# Patient Record
Sex: Male | Born: 1975 | Hispanic: No | Marital: Single | Smoking: Former smoker
Health system: Southern US, Community
[De-identification: ages and names within clinical notes are randomized; demographics above are authoritative.]

## PROBLEM LIST (undated history)

## (undated) DIAGNOSIS — Q676 Pectus excavatum: Secondary | ICD-10-CM

## (undated) DIAGNOSIS — J189 Pneumonia, unspecified organism: Secondary | ICD-10-CM

## (undated) HISTORY — DX: Pectus excavatum: Q67.6

## (undated) HISTORY — PX: CHEST SURGERY: SHX595

## (undated) HISTORY — DX: Pneumonia, unspecified organism: J18.9

---

## 1998-10-11 ENCOUNTER — Encounter: Admission: RE | Admit: 1998-10-11 | Discharge: 1998-10-29 | Payer: Self-pay | Admitting: *Deleted

## 2004-03-29 ENCOUNTER — Ambulatory Visit (HOSPITAL_COMMUNITY): Admission: RE | Admit: 2004-03-29 | Discharge: 2004-03-29 | Payer: Self-pay

## 2006-04-01 ENCOUNTER — Encounter: Admission: RE | Admit: 2006-04-01 | Discharge: 2006-04-24 | Payer: Self-pay | Admitting: *Deleted

## 2010-02-17 ENCOUNTER — Encounter: Payer: Self-pay | Admitting: Urology

## 2013-05-04 ENCOUNTER — Emergency Department (HOSPITAL_COMMUNITY)
Admission: EM | Admit: 2013-05-04 | Discharge: 2013-05-04 | Disposition: A | Discharge status: Home / Self Care | Payer: 59 | Attending: Emergency Medicine | Admitting: Emergency Medicine

## 2013-05-04 ENCOUNTER — Encounter (HOSPITAL_COMMUNITY): Payer: Self-pay | Admitting: Emergency Medicine

## 2013-05-04 DIAGNOSIS — S139XXA Sprain of joints and ligaments of unspecified parts of neck, initial encounter: Secondary | ICD-10-CM | POA: Insufficient documentation

## 2013-05-04 DIAGNOSIS — Z79899 Other long term (current) drug therapy: Secondary | ICD-10-CM | POA: Insufficient documentation

## 2013-05-04 DIAGNOSIS — S339XXA Sprain of unspecified parts of lumbar spine and pelvis, initial encounter: Secondary | ICD-10-CM | POA: Diagnosis not present

## 2013-05-04 DIAGNOSIS — S39012A Strain of muscle, fascia and tendon of lower back, initial encounter: Secondary | ICD-10-CM

## 2013-05-04 DIAGNOSIS — Y92410 Unspecified street and highway as the place of occurrence of the external cause: Secondary | ICD-10-CM | POA: Insufficient documentation

## 2013-05-04 DIAGNOSIS — S161XXA Strain of muscle, fascia and tendon at neck level, initial encounter: Secondary | ICD-10-CM

## 2013-05-04 DIAGNOSIS — S0993XA Unspecified injury of face, initial encounter: Secondary | ICD-10-CM | POA: Diagnosis present

## 2013-05-04 DIAGNOSIS — Y9389 Activity, other specified: Secondary | ICD-10-CM | POA: Diagnosis not present

## 2013-05-04 DIAGNOSIS — S335XXA Sprain of ligaments of lumbar spine, initial encounter: Secondary | ICD-10-CM

## 2013-05-04 MED ORDER — HYDROCODONE-ACETAMINOPHEN 5-325 MG PO TABS: 1.0000 | ORAL_TABLET | Freq: Four times a day (QID) | ORAL | Status: DC | PRN

## 2013-05-04 MED ORDER — NAPROXEN 500 MG PO TABS: 500.0000 mg | ORAL_TABLET | Freq: Two times a day (BID) | ORAL | Status: DC
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
## 2013-05-13 NOTE — ED Provider Notes (Signed)
Medical screening examination/treatment/procedure(s) were performed by non-physician practitioner and as supervising physician I was immediately available for consultation/collaboration.   EKG Interpretation None       Leota Jacobsen, MD 05/13/13 1820

## 2014-01-21 ENCOUNTER — Emergency Department (HOSPITAL_BASED_OUTPATIENT_CLINIC_OR_DEPARTMENT_OTHER)
Admission: EM | Admit: 2014-01-21 | Discharge: 2014-01-21 | Disposition: A | Discharge status: Home / Self Care | Payer: 59 | Attending: Emergency Medicine | Admitting: Emergency Medicine

## 2014-01-21 ENCOUNTER — Encounter (HOSPITAL_BASED_OUTPATIENT_CLINIC_OR_DEPARTMENT_OTHER): Payer: Self-pay

## 2014-01-21 DIAGNOSIS — M791 Myalgia: Secondary | ICD-10-CM | POA: Diagnosis not present

## 2014-01-21 DIAGNOSIS — J111 Influenza due to unidentified influenza virus with other respiratory manifestations: Secondary | ICD-10-CM | POA: Diagnosis not present

## 2014-01-21 DIAGNOSIS — R69 Illness, unspecified: Secondary | ICD-10-CM

## 2014-01-21 DIAGNOSIS — R509 Fever, unspecified: Secondary | ICD-10-CM | POA: Diagnosis present

## 2014-01-21 MED ORDER — ONDANSETRON 4 MG PO TBDP: 4.0000 mg | ORAL_TABLET | Freq: Three times a day (TID) | ORAL | Status: DC | PRN

## 2014-01-21 MED ORDER — OSELTAMIVIR PHOSPHATE 75 MG PO CAPS: 75.0000 mg | ORAL_CAPSULE | Freq: Two times a day (BID) | ORAL | Status: DC

## 2014-01-21 MED ORDER — HYDROCOD POLST-CHLORPHEN POLST 10-8 MG/5ML PO LQCR: 5.0000 mL | Freq: Two times a day (BID) | ORAL | Status: DC | PRN

## 2014-01-21 MED ORDER — IBUPROFEN 800 MG PO TABS
800.0000 mg | ORAL_TABLET | Freq: Once | ORAL | Status: AC
  Administered 2014-01-21: 800 mg via ORAL
  Filled 2014-01-21: qty 1

## 2014-01-21 NOTE — Discharge Instructions (Signed)
Take the prescribed medication as directed.  Drink plenty of fluids and rest. Follow-up with your primary care physician. Return to the ED for new or worsening symptoms.

## 2014-01-21 NOTE — ED Notes (Signed)
Patient here with fever that started yesterday with aching and congestion. Has been taking tylenol with minimal relief

## 2014-01-21 NOTE — ED Provider Notes (Signed)
CSN: [id number]     Arrival date & time 01/21/14  1827 History   First MD Initiated Contact with Patient 01/21/14 1912     Chief Complaint  Patient presents with  . Fever     (Consider location/radiation/quality/duration/timing/severity/associated sxs/prior Treatment) Patient is a 38 y.o. male presenting with fever. The history is provided by the patient and medical records.  Fever Associated symptoms: congestion, cough and myalgias     This is a 38 year old male with no significant past medical history presenting to the ED for fever, nasal congestion, generalized body aches. Patient states symptoms initially began yesterday, very mild in nature but upon waking this morning were severe. He states his entire body "aches". Fever earlier today was as high as 103.26F. He endorses a dry cough and nausea. No abdominal pain, vomiting, or diarrhea. No chest pain or shortness of breath. Patient is a multiple sick contacts over the past several days. Has taken Tylenol with minimal improvement of symptoms.  No headache, dizziness, weakness, neck pain, tinnitus, or confusion.  History reviewed. No pertinent past medical history. History reviewed. No pertinent past surgical history. No family history on file. History  Substance Use Topics  . Smoking status: Never Smoker   . Smokeless tobacco: Not on file  . Alcohol Use: No    Review of Systems  Constitutional: Positive for fever.  HENT: Positive for congestion.   Respiratory: Positive for cough.   Musculoskeletal: Positive for myalgias.  All other systems reviewed and are negative.     Allergies  Augmentin; Tape; and Tetracyclines & related  Home Medications   Prior to Admission medications   Medication Sig Start Date End Date Taking? Authorizing Provider  acetaminophen (TYLENOL) 500 MG tablet Take 1,000 mg by mouth every 4 (four) hours as needed. Took last dose 5pm   Yes Historical Provider, MD  OVER THE COUNTER MEDICATION Take 1  tablet by mouth daily. Potassium vitamin    Historical Provider, MD   BP 127/92 mmHg  Pulse 108  Temp(Src) 101.2 F (38.4 C) (Oral)  Resp 20  Wt 222 lb (100.699 kg)  SpO2 97%   Physical Exam  Constitutional: He is oriented to person, place, and time. He appears well-developed and well-nourished.  Appears flushed, warm to touch  HENT:  Head: Normocephalic and atraumatic.  Right Ear: Tympanic membrane and ear canal normal.  Left Ear: Tympanic membrane and ear canal normal.  Nose: Nose normal.  Mouth/Throat: Uvula is midline, oropharynx is clear and moist and mucous membranes are normal. No oropharyngeal exudate, posterior oropharyngeal edema, posterior oropharyngeal erythema or tonsillar abscesses.  Tonsils normal in appearance bilaterally without exudate; uvula midline without peritonsillar abscess; handling secretions appropriately; no difficulty swallowing or speaking  Eyes: Conjunctivae and EOM are normal. Pupils are equal, round, and reactive to light.  Neck: Normal range of motion and full passive range of motion without pain. Neck supple. No spinous process tenderness and no muscular tenderness present. No rigidity. No edema present.  No meningismus  Cardiovascular: Normal rate, regular rhythm and normal heart sounds.   Pulmonary/Chest: Effort normal and breath sounds normal. No accessory muscle usage. No respiratory distress. He has no wheezes. He has no rhonchi. He has no rales.  Dry cough noted, lungs clear bilaterally  Abdominal: Soft. Bowel sounds are normal. There is no tenderness. There is no guarding.  Musculoskeletal: Normal range of motion. He exhibits no edema.  Neurological: He is alert and oriented to person, place, and time.  Skin: Skin is  warm and dry.  Psychiatric: He has a normal mood and affect.  Nursing note and vitals reviewed.   ED Course  Procedures (including critical care time) Labs Review Labs Reviewed - No data to display  Imaging Review No  results found.   EKG Interpretation None      MDM   Final diagnoses:  Influenza-like illness   38 year old male with fever, cough, nasal congestion and generalized body aches. On exam, patient febrile but nontoxic in appearance. Lungs clear bilaterally without wheezes or rhonchi to suggest pneumonia.  No nuchal rigidity to suggest meningitis.  Patient with likely influenza or other viral illness. He will be started on Tamiflu, cough medication, and Zofran. He was encouraged to continue Tylenol or Motrin at home as needed for fever. Will follow-up with primary care physician.  Discussed plan with patient, he/she acknowledged understanding and agreed with plan of care.  Return precautions given for new or worsening symptoms.  Larene Pickett, PA-C 01/21/14 2128  Larene Pickett, PA-C 01/21/14 2128  Larene Pickett, PA-C 01/21/14 2129  Veryl Speak, MD 01/22/14 [phone]

## 2014-01-27 NOTE — ED Notes (Signed)
Spouse of patient called to state that the patient continues to have fevers of 102.  Was seen and rechecked by PCP.  States patient is able to drink enough fluids. Encouraged to continue to treat fevers with alternating of tylenol and ibuprofen, to use cough with prescribed meds, and if concerned or worsening conditions, to come back in for further testing.

## 2014-01-28 ENCOUNTER — Emergency Department (HOSPITAL_COMMUNITY): Payer: 59

## 2014-01-28 ENCOUNTER — Emergency Department (HOSPITAL_COMMUNITY)
Admission: EM | Admit: 2014-01-28 | Discharge: 2014-01-28 | Disposition: A | Discharge status: Home / Self Care | Payer: 59 | Attending: Emergency Medicine | Admitting: Emergency Medicine

## 2014-01-28 ENCOUNTER — Encounter (HOSPITAL_COMMUNITY): Payer: Self-pay | Admitting: Emergency Medicine

## 2014-01-28 DIAGNOSIS — J159 Unspecified bacterial pneumonia: Secondary | ICD-10-CM | POA: Insufficient documentation

## 2014-01-28 DIAGNOSIS — J189 Pneumonia, unspecified organism: Secondary | ICD-10-CM

## 2014-01-28 DIAGNOSIS — Z79899 Other long term (current) drug therapy: Secondary | ICD-10-CM | POA: Diagnosis not present

## 2014-01-28 DIAGNOSIS — R05 Cough: Secondary | ICD-10-CM | POA: Diagnosis present

## 2014-01-28 LAB — CBC WITH DIFFERENTIAL/PLATELET
BASOS ABS: 0 10*3/uL (ref 0.0–0.1)
BASOS PCT: 0 % (ref 0–1)
EOS PCT: 0 % (ref 0–5)
Eosinophils Absolute: 0 10*3/uL (ref 0.0–0.7)
HEMATOCRIT: 41.4 % (ref 39.0–52.0)
Hemoglobin: 13.5 g/dL (ref 13.0–17.0)
Lymphocytes Relative: 12 % (ref 12–46)
Lymphs Abs: 0.9 10*3/uL (ref 0.7–4.0)
MCH: 26.6 pg (ref 26.0–34.0)
MCHC: 32.6 g/dL (ref 30.0–36.0)
MCV: 81.7 fL (ref 78.0–100.0)
MONO ABS: 1 10*3/uL (ref 0.1–1.0)
Monocytes Relative: 13 % — ABNORMAL HIGH (ref 3–12)
NEUTROS ABS: 5.6 10*3/uL (ref 1.7–7.7)
NEUTROS PCT: 75 % (ref 43–77)
PLATELETS: 215 10*3/uL (ref 150–400)
RBC: 5.07 MIL/uL (ref 4.22–5.81)
RDW: 13.3 % (ref 11.5–15.5)
WBC: 7.6 10*3/uL (ref 4.0–10.5)

## 2014-01-28 LAB — COMPREHENSIVE METABOLIC PANEL
ALBUMIN: 3.1 g/dL — AB (ref 3.5–5.2)
ALK PHOS: 172 U/L — AB (ref 39–117)
ALT: 70 U/L — ABNORMAL HIGH (ref 0–53)
ANION GAP: 10 (ref 5–15)
AST: 38 U/L — ABNORMAL HIGH (ref 0–37)
BILIRUBIN TOTAL: 0.8 mg/dL (ref 0.3–1.2)
BUN: 6 mg/dL (ref 6–23)
CO2: 26 mmol/L (ref 19–32)
CREATININE: 0.83 mg/dL (ref 0.50–1.35)
Calcium: 8.5 mg/dL (ref 8.4–10.5)
Chloride: 98 mEq/L (ref 96–112)
GFR calc Af Amer: >90 mL/min (ref >90)
GFR calc non Af Amer: >90 mL/min (ref >90)
GLUCOSE: 128 mg/dL — AB (ref 70–99)
POTASSIUM: 4 mmol/L (ref 3.5–5.1)
Sodium: 134 mmol/L — ABNORMAL LOW (ref 135–145)
TOTAL PROTEIN: 6.5 g/dL (ref 6.0–8.3)

## 2014-01-28 MED ORDER — AZITHROMYCIN 250 MG PO TABS: ORAL_TABLET | ORAL | Status: DC

## 2014-01-28 MED ORDER — DEXTROSE 5 % IV SOLN
1.0000 g | Freq: Once | INTRAVENOUS | Status: AC
  Administered 2014-01-28: 1 g via INTRAVENOUS
  Filled 2014-01-28: qty 10

## 2014-01-28 MED ORDER — SODIUM CHLORIDE 0.9 % IV BOLUS (SEPSIS)
1000.0000 mL | Freq: Once | INTRAVENOUS | Status: AC
  Administered 2014-01-28: 1000 mL via INTRAVENOUS

## 2014-01-28 MED ORDER — AEROCHAMBER PLUS FLO-VU MEDIUM MISC
1.0000 | Freq: Once | Status: AC
Start: 2014-01-28 — End: 2014-01-28
  Administered 2014-01-28: 1
  Filled 2014-01-28: qty 1

## 2014-01-28 MED ORDER — IPRATROPIUM-ALBUTEROL 0.5-2.5 (3) MG/3ML IN SOLN
3.0000 mL | Freq: Once | RESPIRATORY_TRACT | Status: AC
  Administered 2014-01-28: 3 mL via RESPIRATORY_TRACT
  Filled 2014-01-28: qty 3

## 2014-01-28 MED ORDER — IPRATROPIUM BROMIDE 0.02 % IN SOLN
0.5000 mg | Freq: Once | RESPIRATORY_TRACT | Status: AC
  Administered 2014-01-28: 0.5 mg via RESPIRATORY_TRACT
  Filled 2014-01-28: qty 2.5

## 2014-01-28 MED ORDER — GUAIFENESIN ER 1200 MG PO TB12: 1.0000 | ORAL_TABLET | Freq: Two times a day (BID) | ORAL | Status: DC

## 2014-01-28 MED ORDER — ALBUTEROL SULFATE (2.5 MG/3ML) 0.083% IN NEBU
5.0000 mg | INHALATION_SOLUTION | Freq: Once | RESPIRATORY_TRACT | Status: AC
  Administered 2014-01-28: 5 mg via RESPIRATORY_TRACT
  Filled 2014-01-28: qty 6

## 2014-01-28 MED ORDER — ACETAMINOPHEN-CODEINE 120-12 MG/5ML PO SOLN: 10.0000 mL | ORAL | Status: DC | PRN

## 2014-01-28 MED ORDER — ALBUTEROL SULFATE HFA 108 (90 BASE) MCG/ACT IN AERS
2.0000 | INHALATION_SPRAY | Freq: Once | RESPIRATORY_TRACT | Status: AC
  Administered 2014-01-28: 2 via RESPIRATORY_TRACT
  Filled 2014-01-28: qty 6.7

## 2014-01-28 NOTE — ED Provider Notes (Signed)
CSN: [id number]     Arrival date & time 01/28/14  0407 History   First MD Initiated Contact with Patient 01/28/14 [phone]     Chief Complaint  Patient presents with  . Cough     (Consider location/radiation/quality/duration/timing/severity/associated sxs/prior Treatment) Patient is a 39 y.o. male presenting with cough. The history is provided by the patient. No language interpreter was used.  Cough Cough characteristics:  Productive, hacking and paroxysmal Severity:  Severe Duration:  7 days Timing:  Constant Progression:  Worsening Smoker: no   Associated symptoms: sore throat   Associated symptoms: no chills, no fever, no myalgias and no rash   Associated symptoms comment:  Diagnosed with flu on 12/25 with symptoms of cough, body aches, fever. Symptoms persist now and are worse prompting return visit to the ED. No vomiting. Minimal nasal/sinus congestion.    History reviewed. No pertinent past medical history. History reviewed. No pertinent past surgical history. History reviewed. No pertinent family history. History  Substance Use Topics  . Smoking status: Never Smoker   . Smokeless tobacco: Not on file  . Alcohol Use: No    Review of Systems  Constitutional: Negative for fever and chills.  HENT: Positive for sore throat.   Respiratory: Positive for cough and chest tightness.   Cardiovascular: Negative.   Gastrointestinal: Negative.  Negative for vomiting.  Musculoskeletal: Negative.  Negative for myalgias.  Skin: Negative.  Negative for rash.  Neurological: Negative.       Allergies  Augmentin; Tape; and Tetracyclines & related  Home Medications   Prior to Admission medications   Medication Sig Start Date End Date Taking? Authorizing Provider  acetaminophen (TYLENOL) 500 MG tablet Take 1,000 mg by mouth every 4 (four) hours as needed. Took last dose 5pm   Yes Historical Provider, MD  chlorpheniramine-HYDROcodone (TUSSIONEX PENNKINETIC ER) 10-8 MG/5ML LQCR Take 5  mLs by mouth every 12 (twelve) hours as needed for cough (Cough). 01/21/14  Yes Larene Pickett, PA-C  guaiFENesin (MUCINEX) 600 MG 12 hr tablet Take by mouth 2 (two) times daily as needed for cough.   Yes Historical Provider, MD  ibuprofen (ADVIL,MOTRIN) 200 MG tablet Take 800 mg by mouth every 6 (six) hours as needed for moderate pain.   Yes Historical Provider, MD  ondansetron (ZOFRAN ODT) 4 MG disintegrating tablet Take 1 tablet (4 mg total) by mouth every 8 (eight) hours as needed for nausea. 01/21/14  Yes Larene Pickett, PA-C  OVER THE COUNTER MEDICATION Take 1 tablet by mouth daily. Potassium vitamin   Yes Historical Provider, MD  oseltamivir (TAMIFLU) 75 MG capsule Take 1 capsule (75 mg total) by mouth every 12 (twelve) hours. Patient not taking: Reported on 01/28/2014 01/21/14   Larene Pickett, PA-C   BP 129/71 mmHg  Pulse 116  Temp(Src) 100.1 F (37.8 C) (Oral)  Resp 16  SpO2 90% Physical Exam  Constitutional: He is oriented to person, place, and time. He appears well-developed and well-nourished. No distress.  HENT:  Head: Normocephalic.  Neck: Normal range of motion.  Pulmonary/Chest: Effort normal. Tachypnea noted. He has no wheezes. He has rales.  Abdominal: Soft. There is no tenderness.  Musculoskeletal: He exhibits no edema.  Neurological: He is alert and oriented to person, place, and time.  Skin: Skin is warm and dry.  Psychiatric: He has a normal mood and affect.    ED Course  Procedures (including critical care time) Labs Review Labs Reviewed  COMPREHENSIVE METABOLIC PANEL  CBC WITH DIFFERENTIAL  Imaging Review No results found.   EKG Interpretation None      MDM   Final diagnoses:  None    Cough  Has been treated for viral process x 7-8 days now with worsening symptoms of fever, paroxsymal cough, SOB, malaise and myalgias. His O2 sat is low at 90%, he is tachycardic and appears ill. Suspect pneumonia and potential admission. Patient care  transferred to [facility], PA-C, pending lab and x-ray studies.    Dewaine Oats, PA-C 01/28/14 [place], MD 01/28/14 [phone]

## 2014-01-28 NOTE — ED Notes (Addendum)
Pt reports that he was dx with influenza on 12/25, placed on Tamiflu for 5 days. Seen by PCP on 12/30 and tested negative for strep or influenza panel, but still c/o body aches, fever and cough. Pt states that he continues to have this even after taking OTC Mucinex that the PCP told him to get. Pt states his main concern is his continued coughing has caused bright red streaks of blood in his sputum. Pt reports last taking x3 extra strength tablets of Acetaminophen at 0330.

## 2014-01-28 NOTE — ED Notes (Signed)
With movement/ambulation pt cough agitated. Ascultation over lung sounds post ambulation slight expiratory wheeze heard over lung fields but unlabored in nature.

## 2014-01-28 NOTE — Discharge Instructions (Signed)
Return here as needed.  Your x-ray showed pneumonia.  Follow-up with the clinic provided.  Increase your fluid intake, rest as much as possible

## 2014-01-28 NOTE — ED Provider Notes (Signed)
Patient is ambulated to the bathroom and maintained a pulse oximetry of 94%.  Patient is advised to return here as needed.  He is given a breathing treatment here as well.  The Rocephin.  He will be sent home with Zithromax.  Told to return here as needed  Brent General, PA-C 01/28/14 3893  Evelina Bucy, MD 01/29/14 [phone]

## 2014-02-06 ENCOUNTER — Ambulatory Visit (HOSPITAL_COMMUNITY)
Admission: RE | Admit: 2014-02-06 | Discharge: 2014-02-06 | Disposition: A | Discharge status: Home / Self Care | Payer: 59 | Source: Ambulatory Visit | Attending: *Deleted | Admitting: *Deleted

## 2014-02-06 ENCOUNTER — Other Ambulatory Visit (HOSPITAL_COMMUNITY): Payer: Self-pay | Admitting: *Deleted

## 2014-02-06 DIAGNOSIS — J189 Pneumonia, unspecified organism: Secondary | ICD-10-CM | POA: Diagnosis not present

## 2014-02-13 ENCOUNTER — Ambulatory Visit (HOSPITAL_COMMUNITY)
Admission: RE | Admit: 2014-02-13 | Discharge: 2014-02-13 | Disposition: A | Discharge status: Home / Self Care | Payer: 59 | Source: Ambulatory Visit | Attending: *Deleted | Admitting: *Deleted

## 2014-02-13 ENCOUNTER — Other Ambulatory Visit (HOSPITAL_COMMUNITY): Payer: Self-pay | Admitting: *Deleted

## 2014-02-13 DIAGNOSIS — R0781 Pleurodynia: Secondary | ICD-10-CM | POA: Insufficient documentation

## 2014-02-13 DIAGNOSIS — J189 Pneumonia, unspecified organism: Secondary | ICD-10-CM

## 2014-02-13 DIAGNOSIS — R918 Other nonspecific abnormal finding of lung field: Secondary | ICD-10-CM | POA: Diagnosis not present

## 2014-02-20 ENCOUNTER — Encounter (INDEPENDENT_AMBULATORY_CARE_PROVIDER_SITE_OTHER): Payer: Self-pay

## 2014-02-20 ENCOUNTER — Ambulatory Visit (INDEPENDENT_AMBULATORY_CARE_PROVIDER_SITE_OTHER): Payer: 59 | Admitting: Pulmonary Disease

## 2014-02-20 ENCOUNTER — Encounter: Payer: Self-pay | Admitting: Pulmonary Disease

## 2014-02-20 VITALS — BP 146/82 | HR 104 | Temp 97.3°F | Ht 74.0 in | Wt 215.4 lb

## 2014-02-20 DIAGNOSIS — J189 Pneumonia, unspecified organism: Secondary | ICD-10-CM

## 2014-02-20 NOTE — Assessment & Plan Note (Signed)
The patient has had a severe community-acquired pneumonia at that was bilateral in nature with extensive infiltrates, but has made significant improvement since being on antibiotics. His serial chest x-rays have shown improvement on each successive film, and all of his symptoms are improved as well. I have explained to the patient that it often takes 6-8 weeks for a chest x-ray to completely clear even after optimal treatment for pneumonia. He can continue on Mucinex for mucociliary clearance, but I think it is okay for him to stop the Advair and his second course of Levaquin. He can also use Advil for his musculoskeletal discomfort. I would like to recheck an x-ray in 4 weeks to make sure that everything is clearing, and he is to call me if he feels that his symptoms are worsening.

## 2014-02-20 NOTE — Patient Instructions (Signed)
Stop levaquin and advair Can take albuterol if needed. Stay on your mucinex if it is helping with cough and mucus clearance. Would like to check a followup chest xray in 4 weeks.  Will call you with results. Please call us if you feel you are taking step backwards.

## 2014-02-20 NOTE — Progress Notes (Signed)
   Subjective:    Patient ID: Alan Shepard, male    DOB: 1975-04-23, 39 y.o.   MRN: [id number]  HPI The patient is a 39 year old male who I've been asked to see for community-acquired pneumonia. He started having symptoms on Christmas Day with high fever, dry cough, and malaise. He was felt to possibly have the flu, and was treated with a course of Tamiflu. He did not feel that he got any better, and return to the emergency room on January 2 where he had started producing purulent mucus streaks of blood. A chest x-ray at that time showed significant bilateral lower lobe infiltrates, and he was started on a Z-Pak for community-acquired pneumonia. He was also given IV antibiotics in the emergency room. The patient did have improvement in his symptoms, but was started on a ten-day course of Levaquin by his primary care physician. He has subsequently finished that and started on another 10 days about 2 days ago. Serial chest x-rays have shown definite improvement in his lower lobe process, and he feels that his breathing is much improved. He will still get some shortness of breath and cough with prolonged conversation, but his quantity of mucus is significantly decreased. His mucus also has very little color at this point in time.   Review of Systems  Constitutional: Positive for fever. Negative for unexpected weight change.  HENT: Positive for postnasal drip. Negative for congestion, dental problem, ear pain, nosebleeds, rhinorrhea, sinus pressure, sneezing, sore throat and trouble swallowing.   Eyes: Negative for redness and itching.  Respiratory: Positive for cough, chest tightness, shortness of breath and wheezing.   Cardiovascular: Negative for palpitations and leg swelling.  Gastrointestinal: Negative for nausea and vomiting.  Genitourinary: Negative for dysuria.  Musculoskeletal: Negative for joint swelling.  Skin: Negative for rash.  Neurological: Negative for headaches.  Hematological: Does  not bruise/bleed easily.  Psychiatric/Behavioral: Negative for dysphoric mood. The patient is not nervous/anxious.        Objective:   Physical Exam Constitutional:  Well developed, no acute distress  HENT:  Nares patent without discharge  Oropharynx without exudate, palate and uvula are normal  Eyes:  Perrla, eomi, no scleral icterus  Neck:  No JVD, no TMG  Cardiovascular:  Normal rate, regular rhythm, no rubs or gallops.  No murmurs        Intact distal pulses  Pulmonary :  Normal breath sounds, no stridor or respiratory distress   No rales, rhonchi, or wheezing  Abdominal:  Soft, nondistended, bowel sounds present.  No tenderness noted.   Musculoskeletal:  No lower extremity edema noted.  Lymph Nodes:  No cervical lymphadenopathy noted  Skin:  No cyanosis noted  Neurologic:  Alert, appropriate, moves all 4 extremities without obvious deficit.         Assessment & Plan:

## 2014-02-23 ENCOUNTER — Telehealth (HOSPITAL_COMMUNITY): Payer: Self-pay

## 2014-02-23 NOTE — Telephone Encounter (Signed)
I have called and left a message with Penny to inquire about participation in Pulmonary Rehab. Will send letter in mail and follow up.

## 2014-02-27 ENCOUNTER — Telehealth (HOSPITAL_COMMUNITY): Payer: Self-pay

## 2014-02-27 ENCOUNTER — Institutional Professional Consult (permissible substitution): Payer: 59 | Admitting: Internal Medicine

## 2014-02-27 NOTE — Telephone Encounter (Signed)
I have called and left a second message with Alexiz to inquire about participation in Pulmonary Rehab.

## 2014-03-16 ENCOUNTER — Telehealth (HOSPITAL_COMMUNITY): Payer: Self-pay

## 2014-03-16 NOTE — Telephone Encounter (Signed)
I have called and left a message with Wayde to inquire about participation in Pulmonary Rehab. Will send letter in mail and follow up.

## 2014-03-21 ENCOUNTER — Ambulatory Visit (INDEPENDENT_AMBULATORY_CARE_PROVIDER_SITE_OTHER)
Admission: RE | Admit: 2014-03-21 | Discharge: 2014-03-21 | Disposition: A | Discharge status: Home / Self Care | Payer: 59 | Source: Ambulatory Visit | Attending: Pulmonary Disease | Admitting: Pulmonary Disease

## 2014-03-21 ENCOUNTER — Telehealth: Payer: Self-pay | Admitting: Pulmonary Disease

## 2014-03-21 DIAGNOSIS — J189 Pneumonia, unspecified organism: Secondary | ICD-10-CM

## 2014-03-21 NOTE — Progress Notes (Signed)
Quick Note:  Spoke with pt and notified of results per Dr. Gwenette Greet. Pt verbalized understanding and denied any questions.  ______

## 2014-03-21 NOTE — Telephone Encounter (Signed)
Let the pt know that his pna has completely resolved. The radiologist mentions something on the right that he thinks is related to his sternum where it curves inward. As long as he feels he is back to normal and doing well, no further followup is needed.  Spoke with the pt and notified of results and he verbalized understanding  He states that he is still coughing some and having some chest discomfort, only when he coughs  OV with TP for 03/27/14 at 2 pm

## 2014-03-27 ENCOUNTER — Encounter: Payer: Self-pay | Admitting: Adult Health

## 2014-03-27 ENCOUNTER — Ambulatory Visit (INDEPENDENT_AMBULATORY_CARE_PROVIDER_SITE_OTHER): Payer: 59 | Admitting: Adult Health

## 2014-03-27 VITALS — BP 106/74 | HR 93 | Temp 98.2°F | Ht 74.0 in | Wt 213.6 lb

## 2014-03-27 DIAGNOSIS — J189 Pneumonia, unspecified organism: Secondary | ICD-10-CM

## 2014-03-27 MED ORDER — PREDNISONE 10 MG PO TABS: ORAL_TABLET | ORAL | Status: DC

## 2014-03-27 MED ORDER — LEVALBUTEROL HCL 0.63 MG/3ML IN NEBU
0.6300 mg | INHALATION_SOLUTION | Freq: Once | RESPIRATORY_TRACT | Status: AC
  Administered 2014-03-27: 0.63 mg via RESPIRATORY_TRACT

## 2014-03-27 NOTE — Progress Notes (Signed)
   Subjective:    Patient ID: Alan Shepard, male    DOB: 06-May-1975, 39 y.o.   MRN: [id number]  HPI 39 year old male seen for pulmonary consult for community-acquired pneumonia 02/20/14    03/27/2014 Acute OV  Patient returns for persistent cough. Has a lingering dry cough . Has some congestion on/off.  Dx with CAP 12/2013 . tx w/ abx . Feeling better but cough never resolved.  CXR has been improving. Last cxr 2/23 with resolution of LLL opacity . Decreased RML opacity vs pectus deformity.  No fever, discolored mucus, chest pain, orthopnea or edema.  Not using any cough meds.  Does have some post nasal drainage .      Review of Systems  Constitutional:   No  weight loss, night sweats,  Fevers, chills, fatigue, or  lassitude.  HEENT:   No headaches,  Difficulty swallowing,  Tooth/dental problems, or  Sore throat,                No sneezing, itching, ear ache, nasal congestion, post nasal drip,   CV:  No chest pain,  Orthopnea, PND, swelling in lower extremities, anasarca, dizziness, palpitations, syncope.   GI  No heartburn, indigestion, abdominal pain, nausea, vomiting, diarrhea, change in bowel habits, loss of appetite, bloody stools.   Resp:  No chest wall deformity  Skin: no rash or lesions.  GU: no dysuria, change in color of urine, no urgency or frequency.  No flank pain, no hematuria   MS:  No joint pain or swelling.  No decreased range of motion.  No back pain.  Psych:  No change in mood or affect. No depression or anxiety.  No memory loss.          Objective:   Physical Exam Constitutional:  Well developed, no acute distress  HENT:  Nares patent without discharge  Oropharynx without exudate, palate and uvula are normal  Eyes:  Perrla, eomi, no scleral icterus  Neck:  No JVD, no TMG  Cardiovascular:  Normal rate, regular rhythm, no rubs or gallops.  No murmurs        Intact distal pulses  Pulmonary :  Normal breath sounds, no stridor or respiratory  distress   No rales, rhonchi, or wheezing  Abdominal:  Soft, nondistended, bowel sounds present.  No tenderness noted.   Musculoskeletal:  No lower extremity edema noted.  Lymph Nodes:  No cervical lymphadenopathy noted  Skin:  No cyanosis noted  Neurologic:  Alert, appropriate, moves all 4 extremities without obvious deficit.         Assessment & Plan:

## 2014-03-27 NOTE — Assessment & Plan Note (Signed)
Clinically improving with lingering post pneumonic cough  CXR has shown improvement in pneumonia clearance    Plan  Prednisone taper over the next week. Mucinex as needed for cough and congestion Delsym 2 teaspoons twice daily as needed for cough Allegra 180 mg once daily as needed for drainage Follow-up with Dr. Gwenette Greet in 4-6 weeks with chest x-ray Please contact office for sooner follow up if symptoms do not improve or worsen or seek emergency care

## 2014-03-27 NOTE — Progress Notes (Signed)
Ov reviewed, and agree with plan as outlined.

## 2014-03-27 NOTE — Patient Instructions (Signed)
Prednisone taper over the next week. Mucinex as needed for cough and congestion Delsym 2 teaspoons twice daily as needed for cough Allegra 180 mg once daily as needed for drainage Follow-up with Dr. Gwenette Greet in 4-6 weeks with chest x-ray Please contact office for sooner follow up if symptoms do not improve or worsen or seek emergency care

## 2014-03-29 ENCOUNTER — Telehealth: Payer: Self-pay | Admitting: Pulmonary Disease

## 2014-03-29 MED ORDER — PREDNISONE 10 MG PO TABS: ORAL_TABLET | ORAL | Status: DC

## 2014-03-29 NOTE — Telephone Encounter (Signed)
It was sent electroniclly she wanted to kow if it could be called or faxed please advise.Hillery Hunter

## 2014-03-29 NOTE — Telephone Encounter (Signed)
Rx has been resent. Pt's wife is aware.

## 2014-03-30 ENCOUNTER — Telehealth: Payer: Self-pay | Admitting: Pulmonary Disease

## 2014-03-30 NOTE — Telephone Encounter (Signed)
Spoke with Truddie Crumble at [facility]  She states that pt was originally referred by Octavio Graves Then, when they called the pt to get him set up, he said he had upcoming appt with MR and wanted to wait until then to decide to attend rehab or not  The appt with MR was for a consult, but this was cancelled b/c the pt was seen by [facility] sooner  She states that she spoke with MR about this and he actually signed the order after reviewing the pt's records  She states that since [facility] is really his pulmonologist, he can now decide on whether the pt is appropriate or not  Please advise, thanks

## 2014-04-04 NOTE — Telephone Encounter (Signed)
Spoke with Portia at [facility] aware of below per [facility] - Nothing further needed.

## 2014-04-04 NOTE — Telephone Encounter (Signed)
This is being made very complicated.   He is NOT a candidate for pulmonary rehab.

## 2014-05-05 ENCOUNTER — Ambulatory Visit (INDEPENDENT_AMBULATORY_CARE_PROVIDER_SITE_OTHER)
Admission: RE | Admit: 2014-05-05 | Discharge: 2014-05-05 | Disposition: A | Discharge status: Home / Self Care | Payer: 59 | Source: Ambulatory Visit | Attending: Pulmonary Disease | Admitting: Pulmonary Disease

## 2014-05-05 ENCOUNTER — Encounter: Payer: Self-pay | Admitting: Pulmonary Disease

## 2014-05-05 ENCOUNTER — Ambulatory Visit (INDEPENDENT_AMBULATORY_CARE_PROVIDER_SITE_OTHER): Payer: 59 | Admitting: Pulmonary Disease

## 2014-05-05 VITALS — BP 130/76 | HR 85 | Temp 97.5°F | Ht 74.0 in | Wt 210.8 lb

## 2014-05-05 DIAGNOSIS — R053 Chronic cough: Secondary | ICD-10-CM | POA: Insufficient documentation

## 2014-05-05 DIAGNOSIS — J189 Pneumonia, unspecified organism: Secondary | ICD-10-CM

## 2014-05-05 DIAGNOSIS — R05 Cough: Secondary | ICD-10-CM

## 2014-05-05 NOTE — Assessment & Plan Note (Signed)
The patient has no further chest congestion or purulence, and his chest x-ray shows no acute infiltrate but does show his continued right middle lobe process that is most likely secondary to his pectus excavatum.

## 2014-05-05 NOTE — Progress Notes (Signed)
   Subjective:    Patient ID: Alan Shepard, male    DOB: [birth year], 39 y.o.   MRN: [id number]  HPI Patient comes in today for follow-up of his chronic cough. He was diagnosed earlier in the year with community-acquired pneumonia, but responded well to antibiotics. He had a chest x-ray today that shows no acute infiltrate, but shows his persistent right middle lobe process that is most certainly related to pectus excavatum. The patient has no significant chest congestion, and he is not producing mucus. He feels a significant tickle in the back of his throat and upper chest which then leads to coughing. He is continuously clearing his throat during our visit today. He tells me that his cough is worsened with prolonged conversation, inhaling dust particles, and getting around strong odors.   Review of Systems  Constitutional: Negative for fever and unexpected weight change.  HENT: Positive for congestion and postnasal drip. Negative for dental problem, ear pain, nosebleeds, rhinorrhea, sinus pressure, sneezing, sore throat and trouble swallowing.   Eyes: Negative for redness and itching.  Respiratory: Positive for cough, chest tightness and shortness of breath. Negative for wheezing.   Cardiovascular: Negative for palpitations and leg swelling.  Gastrointestinal: Negative for nausea and vomiting.  Genitourinary: Negative for dysuria.  Musculoskeletal: Negative for joint swelling.  Skin: Negative for rash.  Neurological: Negative for headaches.  Hematological: Does not bruise/bleed easily.  Psychiatric/Behavioral: Negative for dysphoric mood. The patient is not nervous/anxious.        Objective:   Physical Exam Well-developed male in no acute distress Nose without purulence or discharge noted Neck without lymphadenopathy or thyromegaly Chest totally clear to auscultation, no wheezing Cardiac exam with regular rate and rhythm Lower extremities without edema, no cyanosis Alert and  oriented, moves all 4 extremities.       Assessment & Plan:

## 2014-05-05 NOTE — Assessment & Plan Note (Signed)
The patient has a chronic cough that is emanating from a tickle in his throat. It is worsened by prolonged conversation, inhalation of dust, strong odors, and ongoing throat clearing. I think this is clearly upper airway in origin, and would like to work on the various treatments for the upper airway cough syndrome. If he sticks to our recommendations closely, and continues to have the issue, we may need to consider a methacholine challenge test to put the issue of asthma to rest 100%.

## 2014-05-05 NOTE — Patient Instructions (Addendum)
Your breathing tests show no obstruction/asthma today, but does not 100% exclude asthma. Will try you on nasonex 2 sprays each nostril every morning until sample runs out.  Let us know if this helps the cough, and we can send in prescription. Try changing allergra to zyrtec for postnasal drip Avoid throat clearing, use hard candy during the day to help prevent the tickle If your cough continues, can consider a methacholine challenge test to put the issue of asthma to rest.

## 2014-06-16 ENCOUNTER — Telehealth: Payer: Self-pay | Admitting: Pulmonary Disease

## 2014-06-16 MED ORDER — MOMETASONE FUROATE 50 MCG/ACT NA SUSP: 2.0000 | Freq: Every day | NASAL | Status: DC

## 2014-06-16 NOTE — Telephone Encounter (Signed)
Spoke with pt, states the samples of Nasonex have improved his symptoms greatly and is requesting a rx to be sent to [facility].  Per last ov note an rx is to be sent if this works well for pt.  This is sent.    Pt would also like to extend his gratitude to [facility] for the exceptional care he's received.  States he understands [facility] is busy and isn't expecting a response but would just like to thank [facility] for his care.  Forwarding to [facility] to make him aware.

## 2014-06-16 NOTE — Telephone Encounter (Signed)
Ok to send in script with 11 fills.

## 2014-06-19 ENCOUNTER — Telehealth: Payer: Self-pay | Admitting: Pulmonary Disease

## 2014-06-19 MED ORDER — MOMETASONE FUROATE 50 MCG/ACT NA SUSP: 2.0000 | Freq: Every day | NASAL | Status: AC

## 2014-06-19 NOTE — Telephone Encounter (Signed)
Rx has been resent. Pt's wife is aware. Nothing further was needed.

## 2015-12-09 IMAGING — CR DG CHEST 2V
2 series · 2 of 2 positions shown · non-contrast
Comparison: None.

CLINICAL DATA: Pt reports that he was dx with influenza on [DATE],
placed on Tamiflu for 5 days. Seen by PCP on [DATE] and tested
negative for strep or influenza panel, but still c/o body aches,
fever and cough. Pt states that he continues to have this even after
taking OTC Mucinex that the PCP told him to get. Pt states his main
concern is his continued coughing has caused bright red streaks of
blood in his sputum. Pt reports last taking x3 extra strength
tablets of Acetaminophen at 5225. symptoms x 9 days, pt states prior
chest surgery at 11 years of age

EXAM:
CHEST  2 VIEW

[w chest pa]
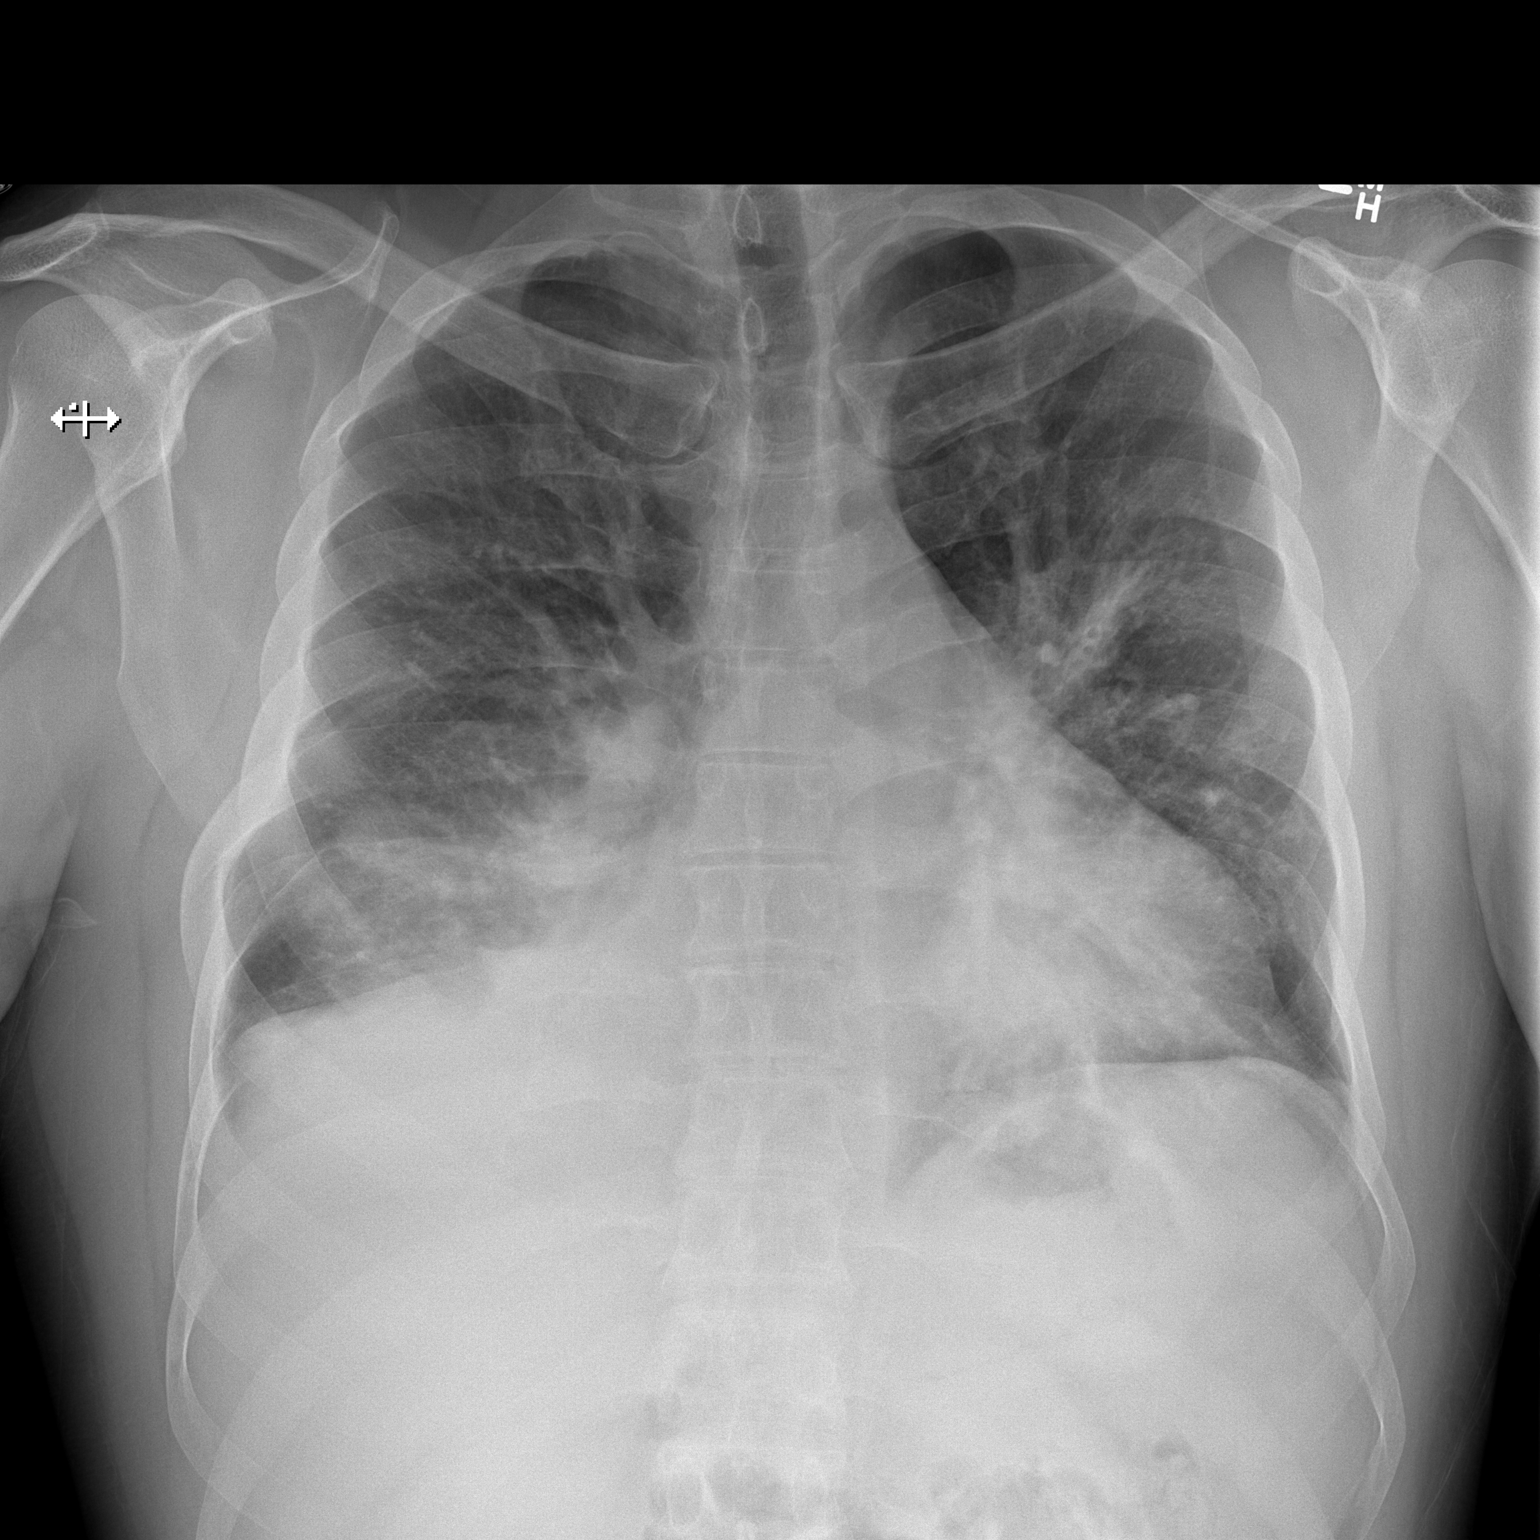

[w chest lat]
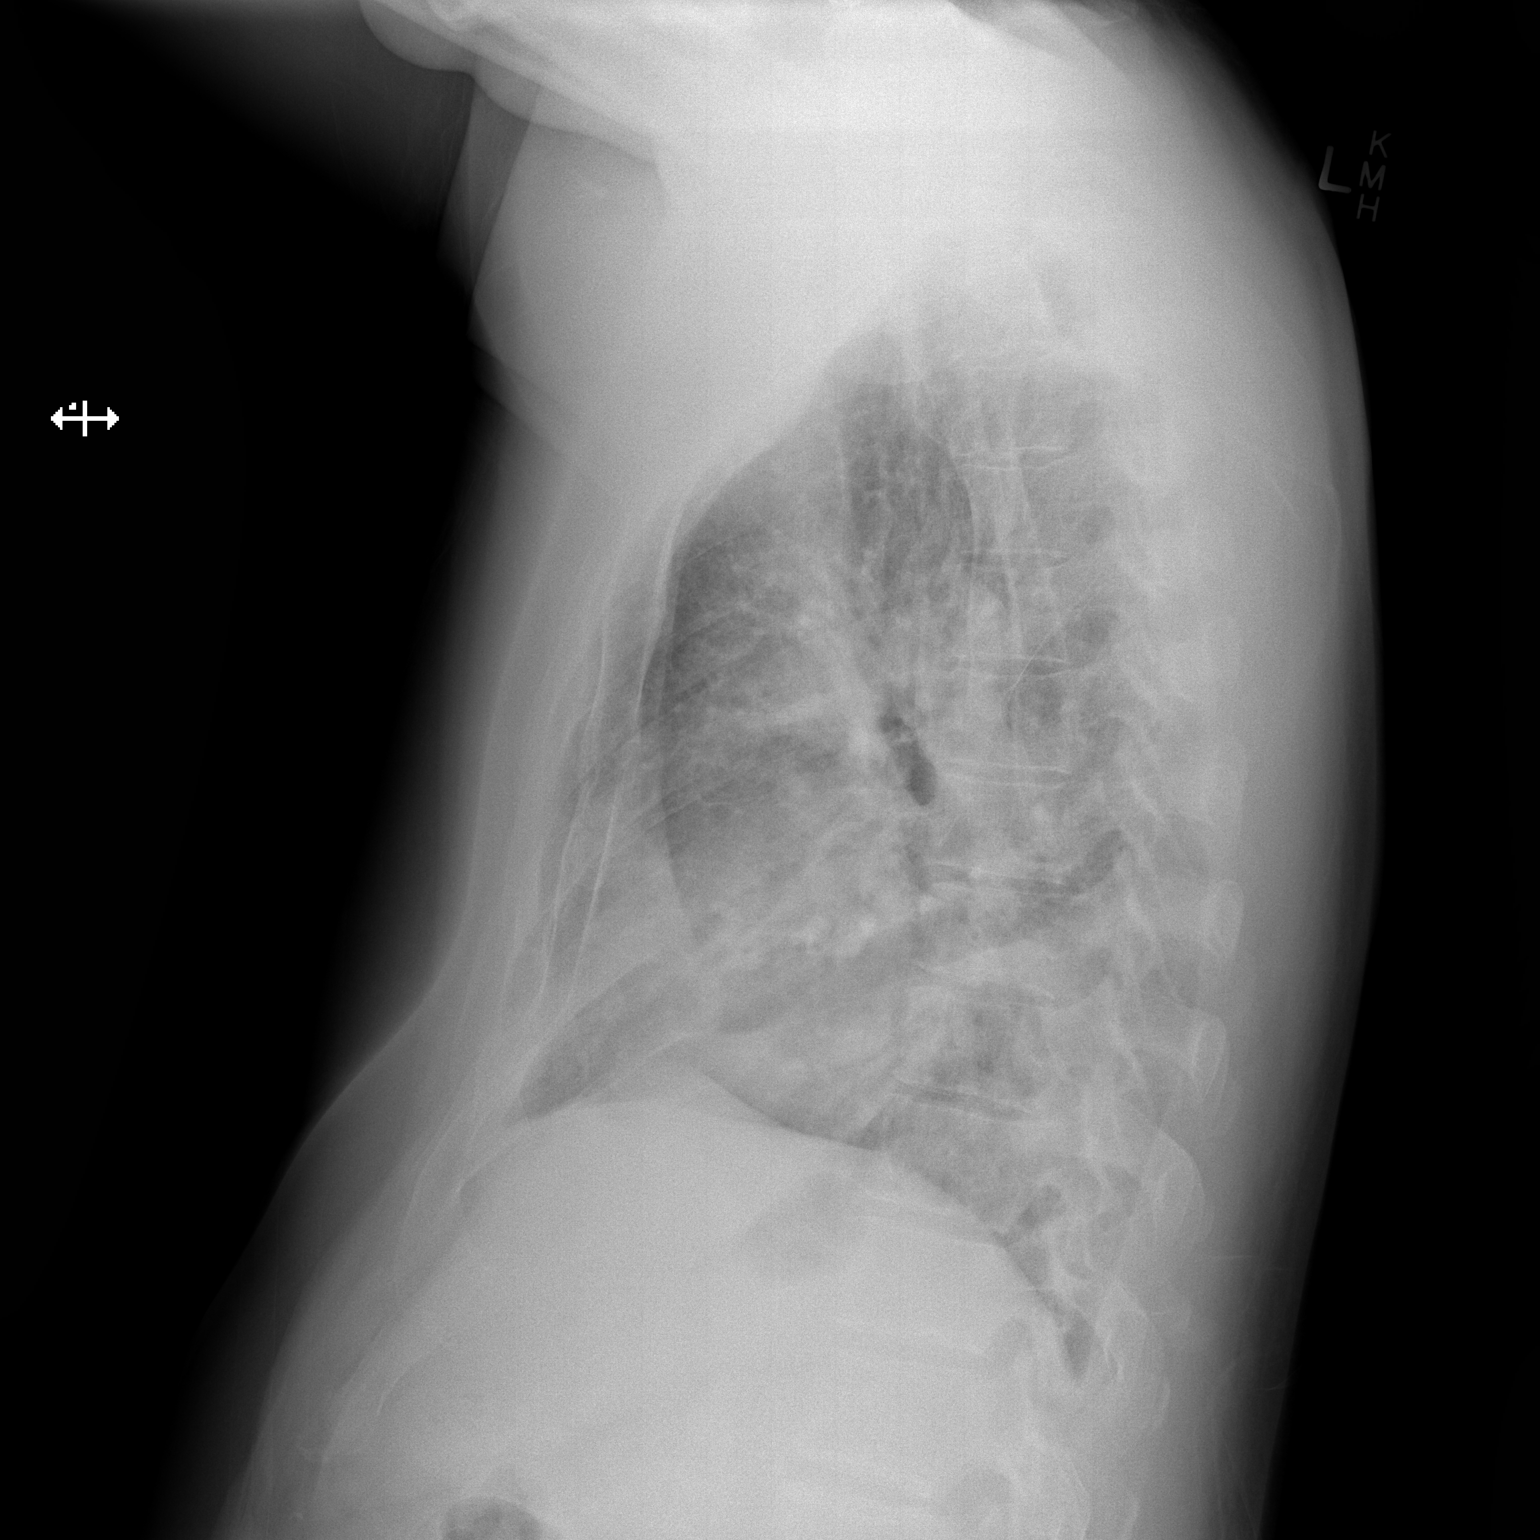

[2 of 2 positions shown; findings below may reference images not displayed]

FINDINGS: Streaky perihilar opacities with consolidation in both lung bases,
greater on the right. This suggest pneumonia. Normal heart size and
pulmonary vascularity. No blunting of costophrenic angles. No
pneumothorax.
IMPRESSION: Streaky perihilar opacities with consolidation in both lung bases,
greater on the right, suggesting pneumonia.

## 2015-12-18 IMAGING — CR DG CHEST 2V
2 series · 2 of 2 positions shown · non-contrast
Comparison: January 28, 2014.

CLINICAL DATA: Pneumonia.  Organism unspecified.

EXAM:
CHEST  2 VIEW

[w chest pa]
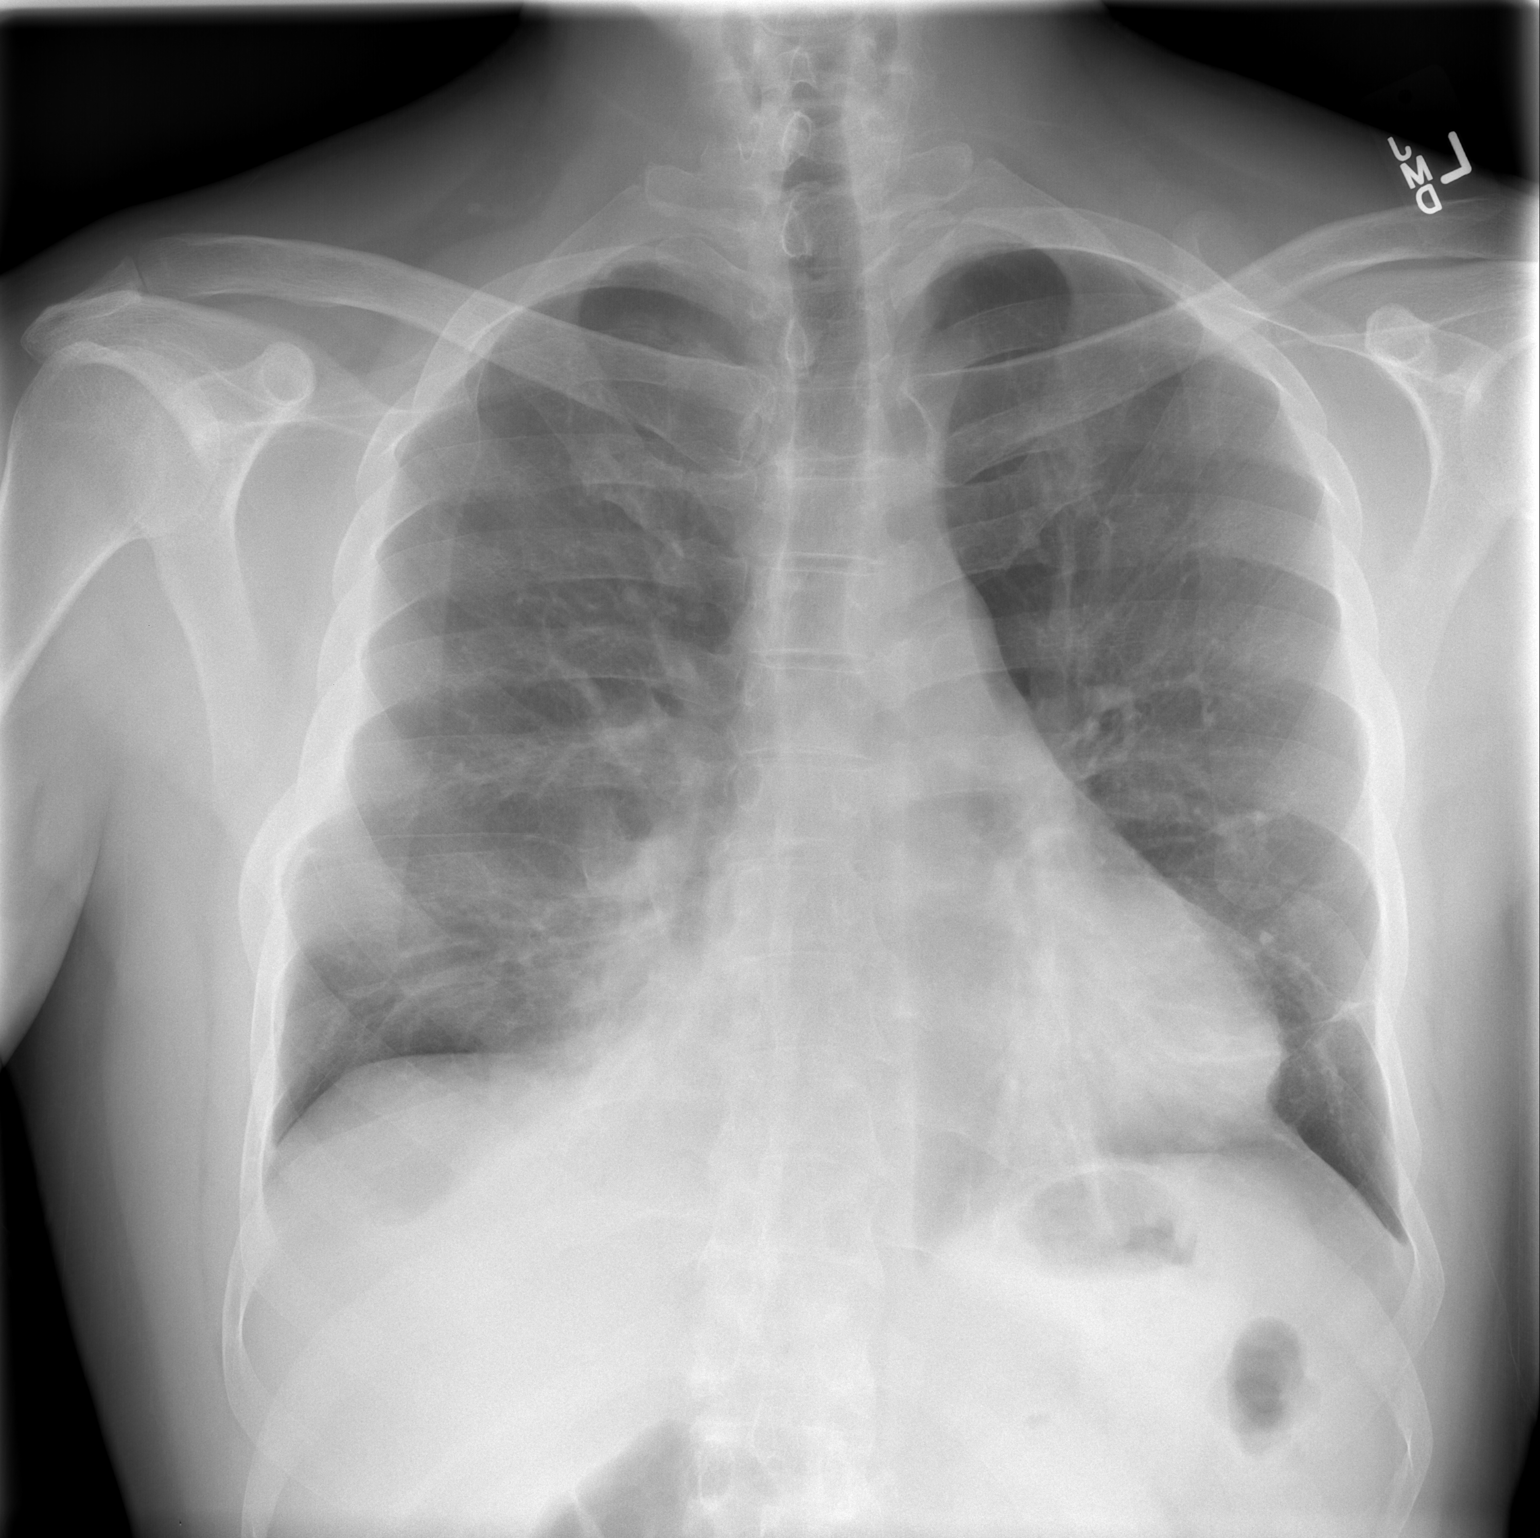

[w chest lat]
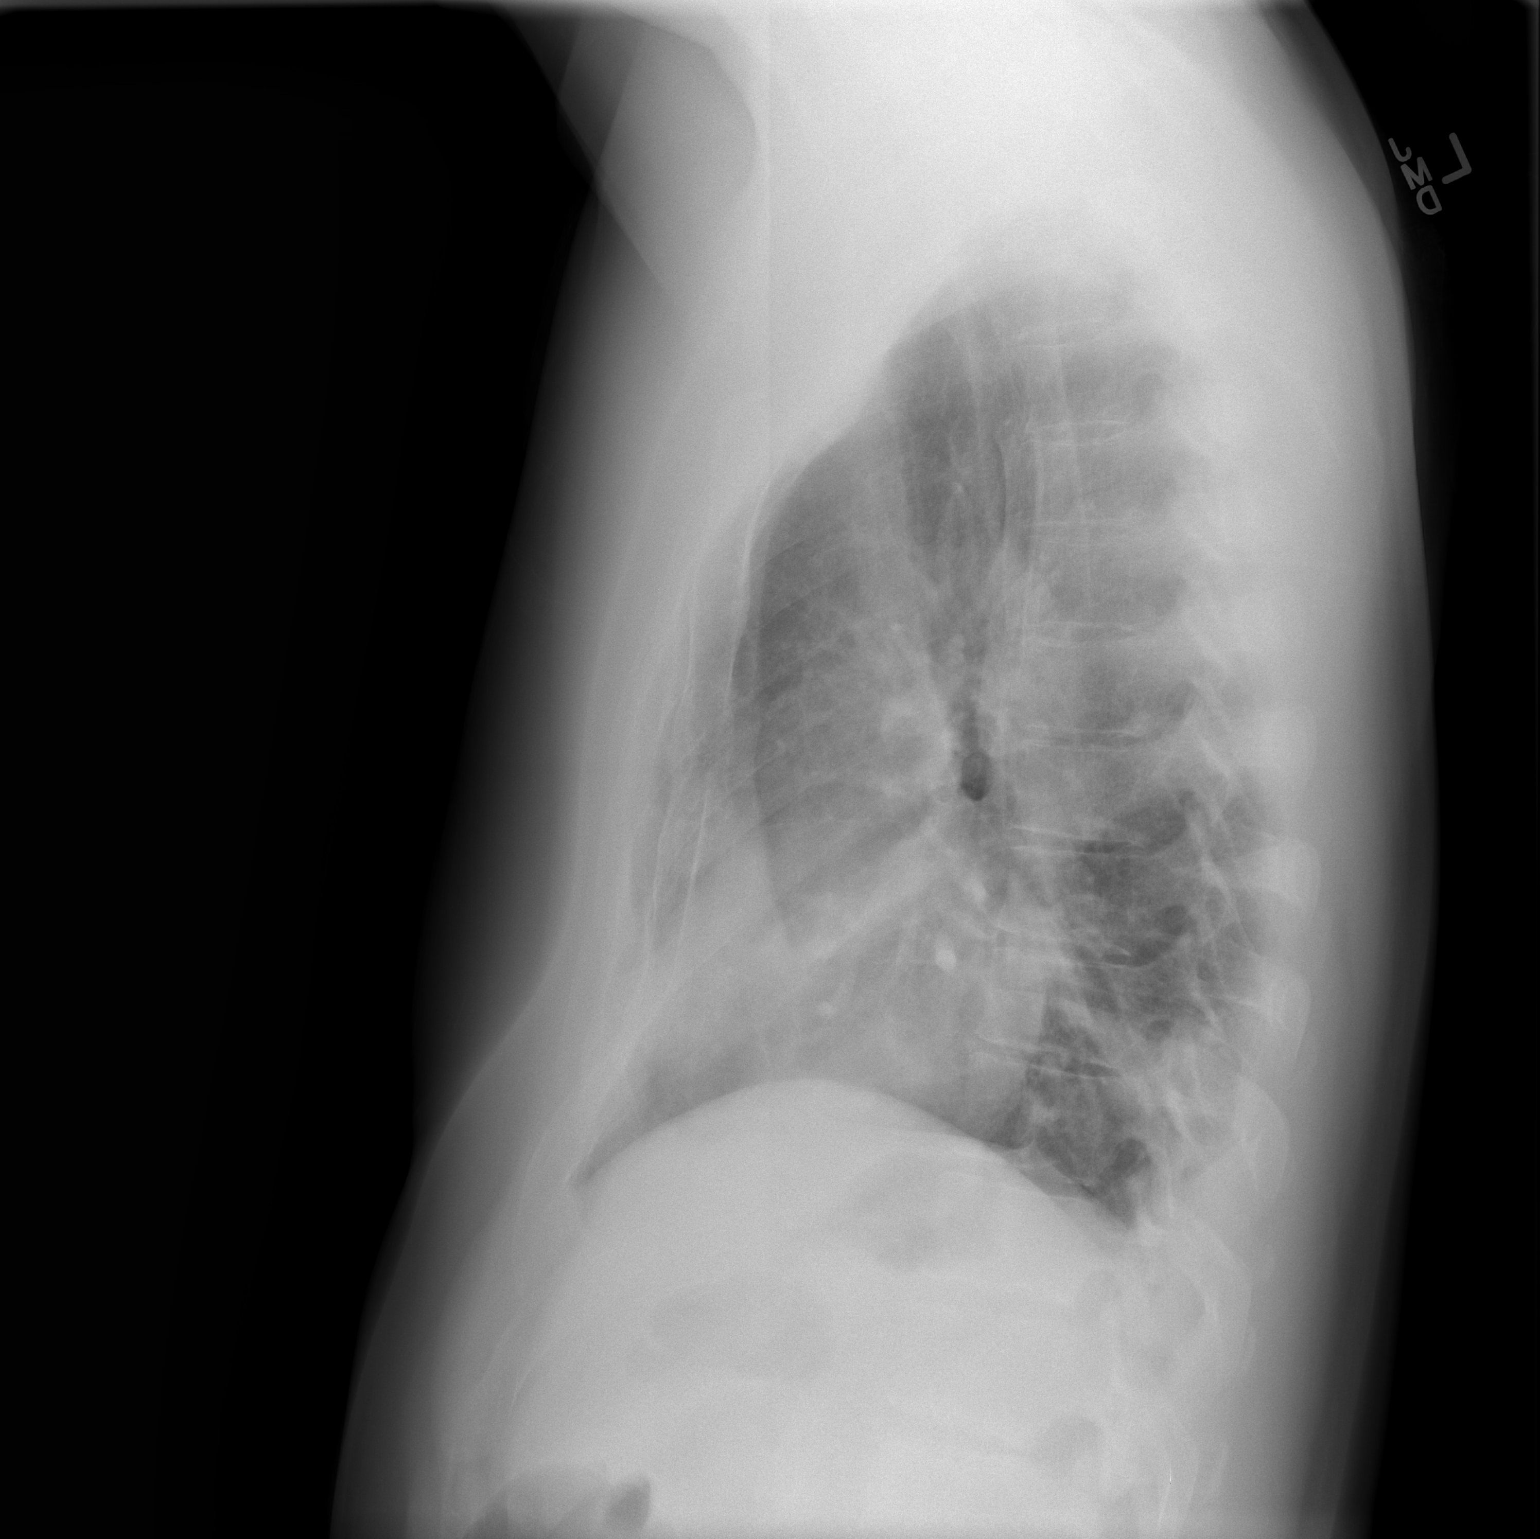

[2 of 2 positions shown; findings below may reference images not displayed]

FINDINGS: The heart size and mediastinal contours are within normal limits. No
pneumothorax or pleural effusion is noted. Improved bibasilar
opacities are noted consistent with improving pneumonia or
atelectasis. The visualized skeletal structures are unremarkable.
IMPRESSION: Decreased bilateral basilar opacities are noted consistent with
improving pneumonia or atelectasis. Continued radiographic follow-up
is recommended to ensure resolution.

## 2015-12-25 IMAGING — CR DG CHEST 2V
2 series · 2 of 2 positions shown · non-contrast
Comparison: 02/06/2014

CLINICAL DATA: Pneumonia, BILATERAL anterior rib pain after
coughing episode

EXAM:
CHEST  2 VIEW

[w chest pa]
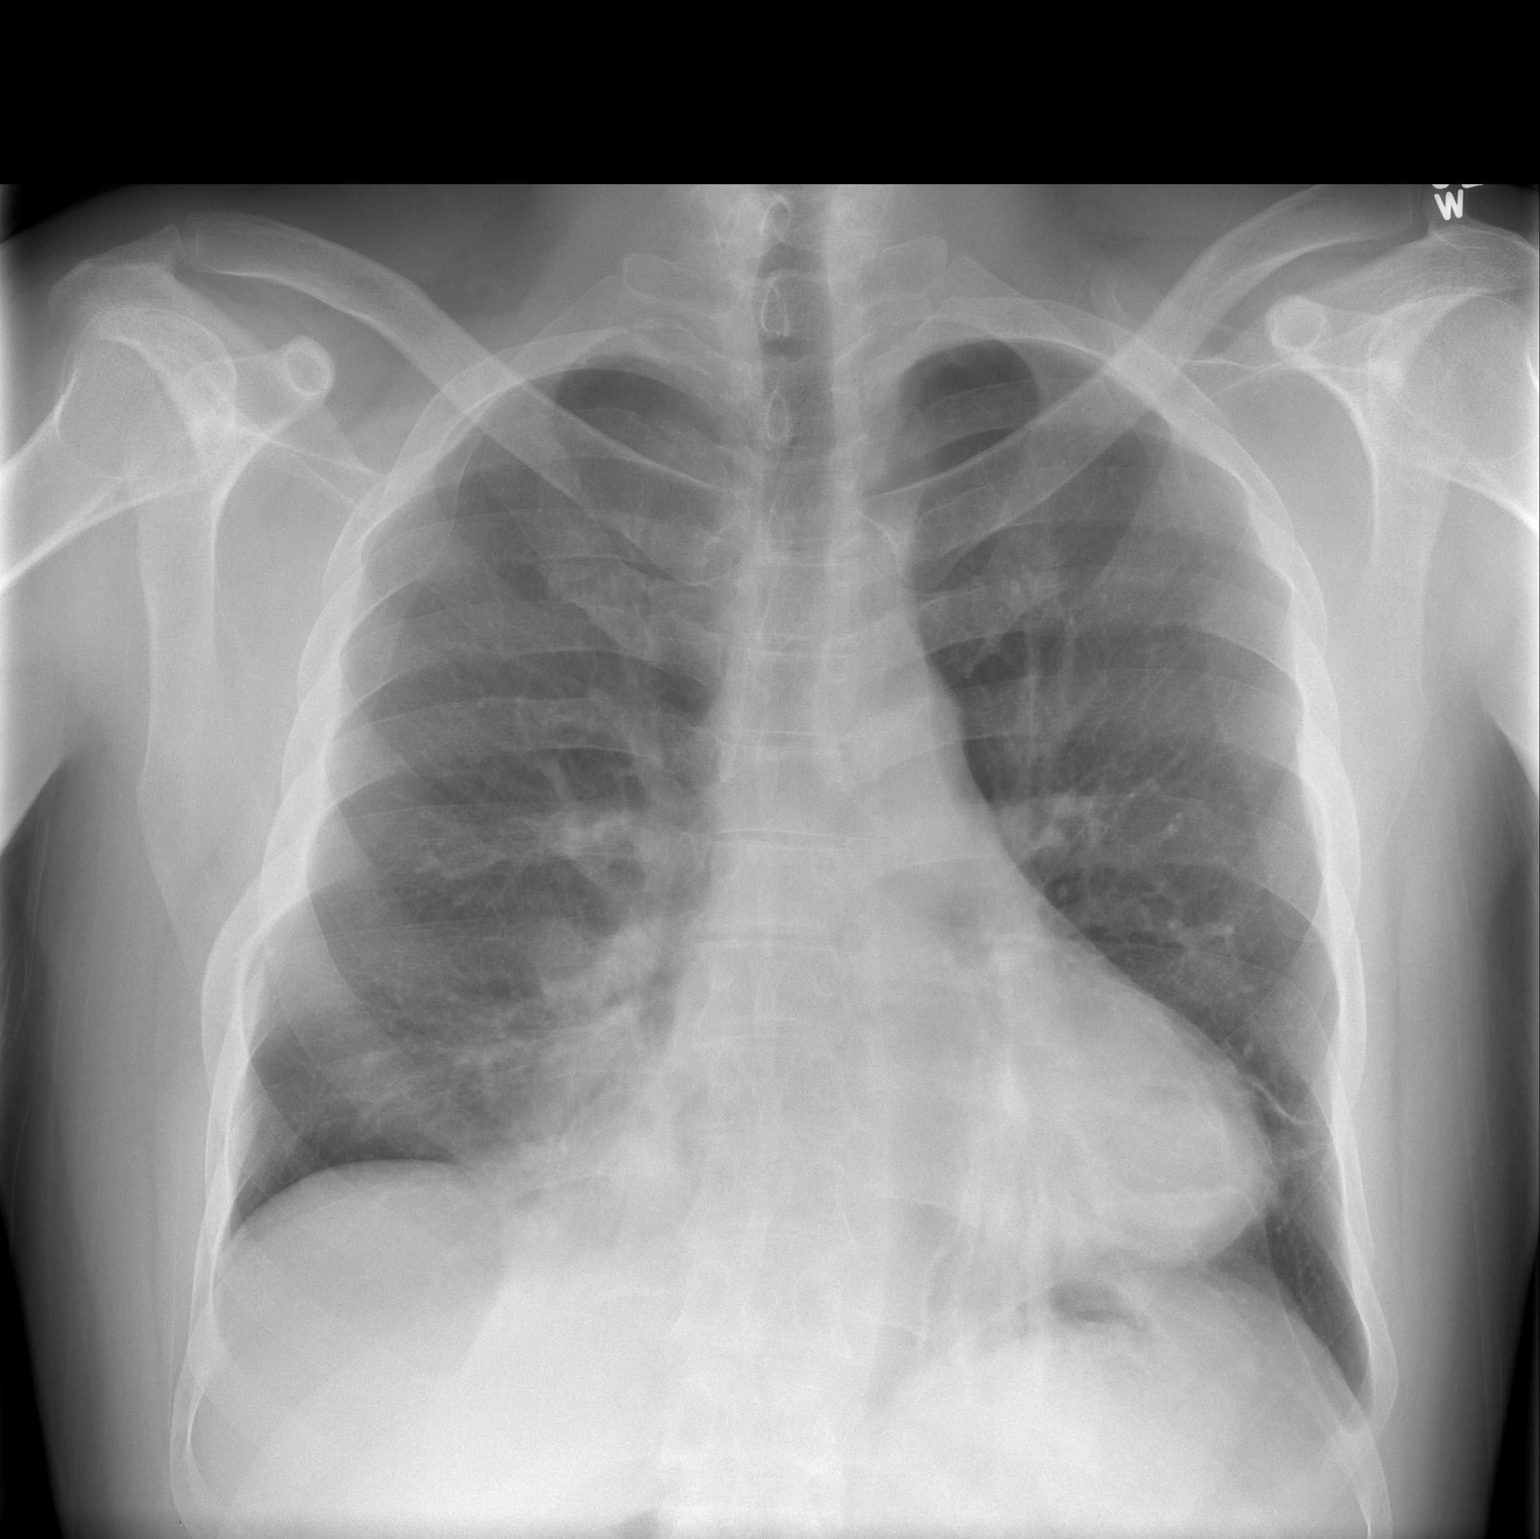

[w chest lat]
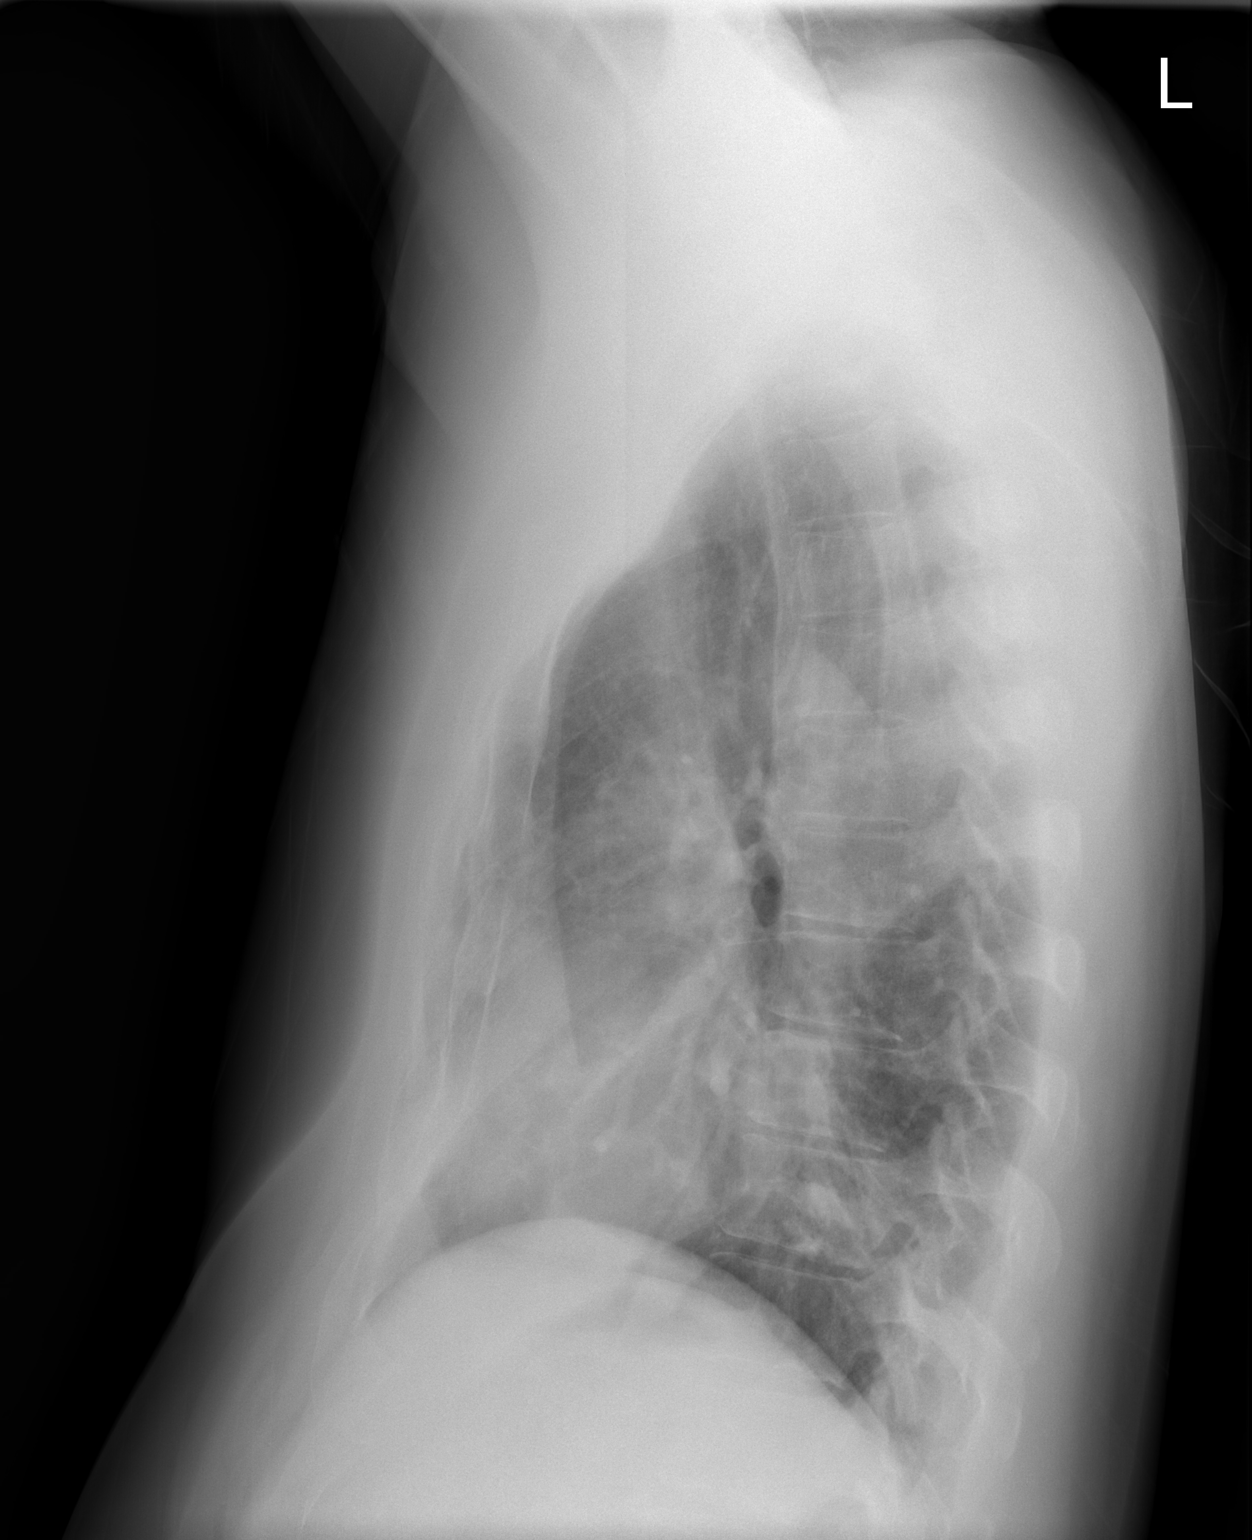

[2 of 2 positions shown; findings below may reference images not displayed]

FINDINGS: Borderline enlargement of cardiac silhouette.

Mediastinal contours and pulmonary vascularity normal.

RIGHT middle lobe consolidation consistent with pneumonia.

Streaky atelectasis versus consolidation in LEFT lower lobe.

Upper lungs clear.

No pleural effusion or pneumothorax.

Osseous structures unremarkable.
IMPRESSION: Persistent RIGHT middle lobe consolidation consistent with
pneumonia.

Persistent atelectasis versus consolidation LEFT lower lobe.

No interval change.
